# Patient Record
Sex: Male | Born: 1980 | Hispanic: No | Marital: Single | Smoking: Never smoker
Health system: Southern US, Community
[De-identification: ages and names within clinical notes are randomized; demographics above are authoritative.]

## PROBLEM LIST (undated history)

## (undated) DIAGNOSIS — J45909 Unspecified asthma, uncomplicated: Secondary | ICD-10-CM

## (undated) DIAGNOSIS — T7840XA Allergy, unspecified, initial encounter: Secondary | ICD-10-CM

## (undated) HISTORY — DX: Allergy, unspecified, initial encounter: T78.40XA

## (undated) HISTORY — DX: Unspecified asthma, uncomplicated: J45.909

---

## 2009-10-03 ENCOUNTER — Encounter: Admission: RE | Admit: 2009-10-03 | Discharge: 2009-10-03 | Payer: Self-pay | Admitting: Infectious Diseases

## 2012-03-10 ENCOUNTER — Ambulatory Visit (INDEPENDENT_AMBULATORY_CARE_PROVIDER_SITE_OTHER): Payer: BC Managed Care – PPO | Admitting: Internal Medicine

## 2012-03-10 VITALS — BP 111/77 | HR 70 | Temp 98.0°F | Resp 18 | Ht 64.5 in | Wt 154.2 lb

## 2012-03-10 DIAGNOSIS — J45901 Unspecified asthma with (acute) exacerbation: Secondary | ICD-10-CM | POA: Insufficient documentation

## 2012-03-10 DIAGNOSIS — J301 Allergic rhinitis due to pollen: Secondary | ICD-10-CM | POA: Insufficient documentation

## 2012-03-10 MED ORDER — BECLOMETHASONE DIPROPIONATE 80 MCG/ACT IN AERS: 1.0000 | INHALATION_SPRAY | RESPIRATORY_TRACT | Status: DC | PRN

## 2012-03-10 MED ORDER — FLUTICASONE PROPIONATE 50 MCG/ACT NA SUSP: 2.0000 | Freq: Every day | NASAL | Status: DC

## 2012-03-10 MED ORDER — PREDNISONE 20 MG PO TABS: ORAL_TABLET | ORAL | Status: DC

## 2012-03-10 MED ORDER — ALBUTEROL SULFATE HFA 108 (90 BASE) MCG/ACT IN AERS: 2.0000 | INHALATION_SPRAY | Freq: Four times a day (QID) | RESPIRATORY_TRACT | Status: DC | PRN

## 2012-03-10 NOTE — Patient Instructions (Signed)
Use both Qvar and Flonase until the end of June

## 2012-03-10 NOTE — Progress Notes (Signed)
  Subjective:    Patient ID: Todd Castro, male    DOB: 03/20/1981, 31 y.o.   MRN: [id number]  HPI36 hours ago he began wheezing and this is not controlled by his use of albuterol There is no fever, URI, pharyngitis He has a long history of asthma which is usually only active in the spring and occasionally in the fall He currently has runny nose with sneezing but no eye irritation which started one to 2 weeks ago His never been hospitalized for his asthma but has needed steroids in the past    Review of SystemsDenies headaches, vision changes, palpitations or chest pain, rashes     Objective:   Physical ExamVital signs reveal mild overweight Eyes are clear with pale conjunctiva Tympanic membranes clear Nares pale and boggy with increased clear rhinorrhea Oropharynx is clear No anterior cervical nodes Lungs exhibit wheezes bilaterally on inspiration and expiration There is no increased work of breathing or tachypnea The heart is regular without murmurs rubs or gallops        Assessment & Plan:  Problem #1 asthma exacerbation Problem #2 uncontrolled allergic rhinitis not responding to over-the-counter antihistamines  Eight-day course of prednisone starting at 80 mg Add Flonase to over-the-counter antihistamines  Add Qvar 80 one puff twice a day Continue albuterol Use this therapy for 3 months Followup in 48-72 hours if not controlled

## 2012-10-21 ENCOUNTER — Emergency Department (HOSPITAL_COMMUNITY)
Admission: EM | Admit: 2012-10-21 | Discharge: 2012-10-21 | Disposition: A | Discharge status: Home / Self Care | Payer: Self-pay | Attending: Emergency Medicine | Admitting: Emergency Medicine

## 2012-10-21 ENCOUNTER — Encounter (HOSPITAL_COMMUNITY): Payer: Self-pay | Admitting: Physical Medicine and Rehabilitation

## 2012-10-21 DIAGNOSIS — R109 Unspecified abdominal pain: Secondary | ICD-10-CM | POA: Insufficient documentation

## 2012-10-21 DIAGNOSIS — J45909 Unspecified asthma, uncomplicated: Secondary | ICD-10-CM | POA: Insufficient documentation

## 2012-10-21 DIAGNOSIS — Z79899 Other long term (current) drug therapy: Secondary | ICD-10-CM | POA: Insufficient documentation

## 2012-10-21 DIAGNOSIS — Z87442 Personal history of urinary calculi: Secondary | ICD-10-CM | POA: Insufficient documentation

## 2012-10-21 DIAGNOSIS — R319 Hematuria, unspecified: Secondary | ICD-10-CM | POA: Insufficient documentation

## 2012-10-21 LAB — CBC WITH DIFFERENTIAL/PLATELET
Basophils Absolute: 0 10*3/uL (ref 0.0–0.1)
Eosinophils Absolute: 0.2 10*3/uL (ref 0.0–0.7)
Eosinophils Relative: 3 % (ref 0–5)
MCH: 30 pg (ref 26.0–34.0)
MCHC: 35 g/dL (ref 30.0–36.0)
MCV: 85.9 fL (ref 78.0–100.0)
Monocytes Absolute: 0.8 10*3/uL (ref 0.1–1.0)
Neutrophils Relative %: 52 % (ref 43–77)
RBC: 5.66 MIL/uL (ref 4.22–5.81)
RDW: 12 % (ref 11.5–15.5)
WBC: 7.6 10*3/uL (ref 4.0–10.5)

## 2012-10-21 LAB — COMPREHENSIVE METABOLIC PANEL
ALT: 184 U/L — ABNORMAL HIGH (ref 0–53)
Albumin: 4.4 g/dL (ref 3.5–5.2)
Alkaline Phosphatase: 53 U/L (ref 39–117)
CO2: 32 mEq/L (ref 19–32)
Calcium: 9.8 mg/dL (ref 8.4–10.5)
Creatinine, Ser: 0.95 mg/dL (ref 0.50–1.35)
GFR calc Af Amer: >90 mL/min (ref >90)
Glucose, Bld: 136 mg/dL — ABNORMAL HIGH (ref 70–99)
Potassium: 4.1 mEq/L (ref 3.5–5.1)
Sodium: 142 mEq/L (ref 135–145)
Total Protein: 8.2 g/dL (ref 6.0–8.3)

## 2012-10-21 LAB — URINALYSIS, ROUTINE W REFLEX MICROSCOPIC
Bilirubin Urine: NEGATIVE
Glucose, UA: NEGATIVE mg/dL
Nitrite: NEGATIVE
Protein, ur: NEGATIVE mg/dL
Specific Gravity, Urine: 1.019 (ref 1.005–1.030)
Urobilinogen, UA: 0.2 mg/dL (ref 0.0–1.0)

## 2012-10-21 LAB — URINE MICROSCOPIC-ADD ON

## 2012-10-21 NOTE — ED Provider Notes (Signed)
History     CSN: [id number]  Arrival date & time 10/21/12  1427   None     Chief Complaint  Patient presents with  . Flank Pain    (Consider location/radiation/quality/duration/timing/severity/associated sxs/prior treatment) HPI R side flank pain stared suddenly about two hours ago.  Pain described as severe, 10/10 in intensity at maximum. Currently the pain is gone. The location of the patient's problem is R flank.  Onset was two hours ago with intermittent course since that time.   Modifying factors:  none.  Associated symptoms: emesis. No dysuria or hematuria. No back pain. No fever.   No past medical history on file.  No past surgical history on file.  History reviewed. No pertinent family history.  History  Substance Use Topics  . Smoking status: Never Smoker   . Smokeless tobacco: Never Used  . Alcohol Use: No      Review of Systems Negative for respiratory distress, cough. Negative for constipation, diarrhea.  All other systems reviewed and negative unless noted in HPI.    Allergies  Review of patient's allergies indicates no known allergies.  Home Medications   Current Outpatient Rx  Name  Route  Sig  Dispense  Refill  . ALBUTEROL SULFATE HFA 108 (90 BASE) MCG/ACT IN AERS   Inhalation   Inhale 2 puffs into the lungs 3 (three) times daily.         . ALBUTEROL SULFATE HFA 108 (90 BASE) MCG/ACT IN AERS   Inhalation   Inhale 2 puffs into the lungs every 6 (six) hours as needed for wheezing.   1 Inhaler   5   . BECLOMETHASONE DIPROPIONATE 80 MCG/ACT IN AERS   Inhalation   Inhale 1 puff into the lungs as needed.   1 Inhaler   12   . FLUTICASONE PROPIONATE 50 MCG/ACT NA SUSP   Nasal   Place 2 sprays into the nose daily.   16 g   6   . PREDNISONE 20 MG PO TABS      4/4/3/3/2/2/1/1 Single daily dose for 8 days   20 tablet   0     BP 154/124  Pulse 97  Temp 98.6 F (37 C) (Oral)  Resp 18  SpO2 98%  Physical Exam Nursing note and  vitals reviewed.  Constitutional: Pt is alert and appears stated age. Oropharynx: Airway open without erythema or exudate. Respiratory: No respiratory distress. Equal breathing bilaterally. CV: Extremities warm and well perfused. Neuro: No motor nor sensory deficit. Head: Normocephalic and atraumatic. Eyes: No conjunctivitis, no scleral icterus. Neck: Supple, no mass. Chest: Non-tender. Abdomen: Soft, non-tender. Negative murphy's sign.  MSK: Extremities are atraumatic without deformity. Skin: No rash, no wounds.  ED Course  Procedures (including critical care time)  Labs Reviewed  COMPREHENSIVE METABOLIC PANEL - Abnormal; Notable for the following:    Glucose, Bld 136 (*)     AST 88 (*)     ALT 184 (*)     All other components within normal limits  URINALYSIS, ROUTINE W REFLEX MICROSCOPIC - Abnormal; Notable for the following:    APPearance HAZY (*)     Hgb urine dipstick LARGE (*)     All other components within normal limits  CBC WITH DIFFERENTIAL  LIPASE, BLOOD  URINE MICROSCOPIC-ADD ON   No results found.   1. Flank pain   2. Hematuria       MDM  31 y.o. male here with R flank pain.  Pertinent past problems include  asthma. Pain colicky in nature. Severe in triage. No pain now. History is c/w passed kidney stone. Will continue to monitor for recurrence of pain and await labs.   Medications/interventions:  None. Pt without pain at time of my evaluation. .  Data reviewed: Record review: n/a. EKG ordered and interpreted by me: not indicated.  Lab tests ordered and reviewed by me: CBC unremarkable. Lipase low. CMP with elevated AST/ALT. Pt without RUQ tenderness to success gall bladder etiology. UA with large amount of blood.    Course of care: On re-eval pt still without pain. Resting comfortably. Discussed likely passed kidney stone and recommended f/u with urology and establishing PCP. Will give urinary strainer. Counseling provided regarding diagnosis,  treatment plan, follow up recommendations, and return precautions. Questions answered.  Medical Decision Making discussed with ED attending Doug Sou, MD          Charm Barges, MD 10/21/12 [phone]

## 2012-10-21 NOTE — ED Notes (Signed)
Pt reports about 2 hours ago he started having right sided flank pain that increasingly became worse. Pt reports he was in excruciating pain rating it at a 10/10. Pt now reports pain is gone and he feels much better. Pt denies burning sensation while urinating and urinary frequency.

## 2012-10-21 NOTE — ED Notes (Signed)
Pt refused IV. Pt reports he wants the lab results to come back before he gets an IV. Pt was told that it will delay care later on if he needs IV medication. Pt acknowledged understanding and still refused IV.

## 2012-10-21 NOTE — ED Notes (Signed)
Pt given urine cup and asked for a urine sample, pt reports he is unable to urinate at this time.

## 2012-10-21 NOTE — ED Notes (Signed)
Pt presents to department for evaluation of R sided flank pain. Sudden onset today. Denies urinary symptoms. 10/10 pain at the time. Pt is alert and oriented x4. Moaning and unable to sit still in triage.

## 2012-10-21 NOTE — ED Provider Notes (Signed)
Complained of right flank pain onset 2 hours prior to coming here resolved spontaneously without treatment vomited one time. Symptoms lasted 2 hours patient now asymptomatic pain is severe. Last bowel movement yesterday, normal no fever exam alert nontoxic lungs clear auscultation heart regular rhythm abdomen soft nontender. No flank tenderness genitalia normal male   Doug Sou, MD 10/21/12 1520

## 2012-10-22 NOTE — ED Provider Notes (Signed)
I have personally seen and examined the patient.  I have discussed the plan of care with the resident.  I have reviewed the documentation on PMH/FH/Soc. History.  I have reviewed the documentation of the resident and agree.  Doug Sou, MD 10/22/12 [phone]

## 2013-04-06 ENCOUNTER — Encounter: Payer: Self-pay | Admitting: Family Medicine

## 2013-04-06 ENCOUNTER — Ambulatory Visit: Payer: Self-pay | Admitting: Physician Assistant

## 2013-04-06 ENCOUNTER — Ambulatory Visit: Payer: Self-pay

## 2013-04-06 VITALS — BP 112/84 | HR 70 | Temp 98.8°F | Resp 16 | Ht 64.5 in | Wt 165.4 lb

## 2013-04-06 DIAGNOSIS — Z0000 Encounter for general adult medical examination without abnormal findings: Secondary | ICD-10-CM

## 2013-04-06 DIAGNOSIS — Z111 Encounter for screening for respiratory tuberculosis: Secondary | ICD-10-CM

## 2013-04-06 DIAGNOSIS — J45909 Unspecified asthma, uncomplicated: Secondary | ICD-10-CM

## 2013-04-06 MED ORDER — PREDNISONE 20 MG PO TABS: ORAL_TABLET | ORAL | Status: DC

## 2013-04-06 MED ORDER — FLUTICASONE PROPIONATE 50 MCG/ACT NA SUSP: 2.0000 | Freq: Every day | NASAL | Status: DC

## 2013-04-06 MED ORDER — ALBUTEROL SULFATE HFA 108 (90 BASE) MCG/ACT IN AERS: 2.0000 | INHALATION_SPRAY | Freq: Four times a day (QID) | RESPIRATORY_TRACT | Status: DC | PRN

## 2013-04-06 NOTE — Progress Notes (Signed)
Patient ID: Todd Castro MRN: [id number], DOB: October 02, 1981 32 y.o. Date of Encounter: 04/06/2013, 5:25 PM  Primary Physician: Default, Provider, MD  Chief Complaint: Physical (CPE)  HPI: 32 y.o. 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 Doing well. Originally from [place], moved here 12 years ago. 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 At this time he was found to have a negative 1 view CXR. Asymptomatic. 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 Program is 3 semesters.   He also requests a refill of his albuterol and Flonase. Notes this time of year his asthma seems to flare up on him. This time of year he notes that he uses his albuterol inhaler 2-3 times per day. At baseline he does not have to use his albuterol inhaler at all. Symptoms are worse at night. Some wheezing and shortness of breath. Some associated sneezing during the day. Afebrile.   Review of Systems: Consitutional: No fever, chills, fatigue, night sweats, lymphadenopathy, or weight changes. Eyes: No visual changes, eye redness, or discharge. ENT/Mouth: Ears: No otalgia, tinnitus, hearing loss, discharge. Nose: Positive for sneezing. No congestion, rhinorrhea, sinus pain, or epistaxis. Throat: No sore throat, post nasal drip, or teeth pain. Cardiovascular: No CP, palpitations, diaphoresis, DOE, edema, orthopnea, PND. Respiratory: Positive for SOB and wheezing. No cough, dyspnea, or hemoptysis. Gastrointestinal: No anorexia, dysphagia, reflux, pain, nausea, vomiting, hematemesis, diarrhea, constipation, BRBPR, or melena. Genitourinary: No dysuria, frequency, urgency, hematuria, incontinence, nocturia, decreased urinary stream, discharge, impotence, or testicular pain/masses. Musculoskeletal: No decreased ROM, myalgias, stiffness, joint swelling, or weakness. Skin: No rash, erythema, lesion  changes, pain, warmth, jaundice, or pruritis. Neurological: No headache, dizziness, syncope, seizures, tremors, memory loss, coordination problems, or paresthesias. Psychological: No anxiety, depression, hallucinations, SI/HI. Endocrine: No fatigue, polydipsia, polyphagia, polyuria, or known diabetes.   Past Medical History  Diagnosis Date  . Allergy   . Asthma      No past surgical history on file.  Home Meds:  Prior to Admission medications   Medication Sig Start Date End Date Taking? Authorizing Provider  albuterol (PROVENTIL HFA;VENTOLIN HFA) 108 (90 BASE) MCG/ACT inhaler Inhale 2 puffs into the lungs every 6 (six) hours as needed. For wheezing   Yes Historical Provider, MD  Cetirizine HCl (ZYRTEC PO) Take 1 tablet by mouth daily.   Yes Historical Provider, MD  fluticasone (FLONASE) 50 MCG/ACT nasal spray Place 2 sprays into the nose daily.   Yes Historical Provider, MD  loratadine (CLARITIN) 10 MG tablet Take 10 mg by mouth daily.    Historical Provider, MD    Allergies:  Allergies  Allergen Reactions  . Ppd (Tuberculin Purified Protein Derivative) Itching and Swelling    History   Social History  . Marital Status: Single    Spouse Name: N/A    Number of Children: N/A  . Years of Education: N/A   Occupational History  . Not on file.   Social History Main Topics  . Smoking status: Never Smoker   . Smokeless tobacco: Never Used  . Alcohol Use: No  . Drug Use: No  . Sexually Active: Yes   Other Topics Concern  . Not on file   Social History Narrative  . No narrative on file    No family history on file.  Physical Exam: Blood pressure 112/84, pulse 70, temperature 98.8 F (37.1 C),  temperature source Oral, resp. rate 16, height 5' 4.5" (1.638 m), weight 165 lb 6.4 oz (75.025 kg), SpO2 97.00%.  General: Well developed, well nourished, in no acute distress. HEENT: Normocephalic, atraumatic. Conjunctiva pink, sclera non-icteric. Pupils 2 mm constricting to 1  mm, round, regular, and equally reactive to light and accomodation. EOMI. Internal auditory canal clear. TMs with good cone of light and without pathology. Nasal mucosa pink. Nares are without discharge. No sinus tenderness. Oral mucosa pink. Dentition normal. Pharynx without exudate.   Neck: Supple. Trachea midline. No thyromegaly. Full ROM. No lymphadenopathy. Lungs: Clear to auscultation bilaterally without wheezes, rales, or rhonchi. Breathing is of normal effort and unlabored. Cardiovascular: RRR with S1 S2. No murmurs, rubs, or gallops appreciated. Distal pulses 2+ symmetrically. No carotid or abdominal bruits. Abdomen: Soft, non-tender, non-distended with normoactive bowel sounds. No hepatosplenomegaly or masses. No rebound/guarding. No CVA tenderness. Without hernias.   Genitourinary: Uncircumcised male. No penile lesions. Testes descended bilaterally, and smooth without tenderness or masses.  Musculoskeletal: Full range of motion and 5/5 strength throughout. Without swelling, atrophy, tenderness, crepitus, or warmth. Extremities without clubbing, cyanosis, or edema. Calves supple. Skin: Warm and moist without erythema, ecchymosis, wounds, or rash. Neuro: A+Ox3. CN II-XII grossly intact. Moves all extremities spontaneously. Full sensation throughout. Normal gait. DTR 2+ throughout upper and lower extremities. Finger to nose intact. Psych:  Responds to questions appropriately with a normal affect.   Studies:  CXR: [facility] reading (PRIMARY) by  Dr. Merla Riches. Negative  Assessment/Plan:  32 y.o. 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 Had negative CXR in 2012. Performed 1 view CXR today  to satisfy requirement of school paperwork while keeping patient's finances in mind. -He will proceed to the [facility] for his second varicella vaccine. -Form completed on a provisional status pending the above vaccine -See eye MD -Anticipatory guidance   Signed, Eula Listen, PA-C 04/06/2013 5:25 PM

## 2014-04-07 ENCOUNTER — Ambulatory Visit: Payer: Self-pay | Admitting: Emergency Medicine

## 2014-04-07 VITALS — BP 102/70 | HR 77 | Temp 98.1°F | Resp 18 | Ht 61.5 in | Wt 164.0 lb

## 2014-04-07 DIAGNOSIS — J45909 Unspecified asthma, uncomplicated: Secondary | ICD-10-CM

## 2014-04-07 DIAGNOSIS — J45901 Unspecified asthma with (acute) exacerbation: Secondary | ICD-10-CM

## 2014-04-07 DIAGNOSIS — J309 Allergic rhinitis, unspecified: Secondary | ICD-10-CM

## 2014-04-07 MED ORDER — MOMETASONE FURO-FORMOTEROL FUM 100-5 MCG/ACT IN AERO: 2.0000 | INHALATION_SPRAY | Freq: Two times a day (BID) | RESPIRATORY_TRACT | Status: DC

## 2014-04-07 MED ORDER — MOMETASONE FURO-FORMOTEROL FUM 100-5 MCG/ACT IN AERO: 2.0000 | INHALATION_SPRAY | Freq: Two times a day (BID) | RESPIRATORY_TRACT | Status: AC

## 2014-04-07 MED ORDER — FLUTICASONE PROPIONATE 50 MCG/ACT NA SUSP: 2.0000 | Freq: Every day | NASAL | Status: AC

## 2014-04-07 MED ORDER — FLUTICASONE PROPIONATE 50 MCG/ACT NA SUSP: 2.0000 | Freq: Every day | NASAL | Status: DC

## 2014-04-07 MED ORDER — ALBUTEROL SULFATE HFA 108 (90 BASE) MCG/ACT IN AERS: 2.0000 | INHALATION_SPRAY | RESPIRATORY_TRACT | Status: AC | PRN

## 2014-04-07 MED ORDER — ALBUTEROL SULFATE HFA 108 (90 BASE) MCG/ACT IN AERS: 2.0000 | INHALATION_SPRAY | RESPIRATORY_TRACT | Status: DC | PRN

## 2014-04-07 NOTE — Progress Notes (Signed)
[facility] [address], [place] [address] [phone]  Date:  04/07/2014   Name:  Todd SackLai Thanh Catalina   DOB:  May 19, 1981   MRN:  [id number]  PCP:  Default, Provider, MD    Chief Complaint: Allergies, Asthma and Shortness of Breath   History of Present Illness:  Todd SackLai Thanh Dawood is a 33 y.o. very pleasant male patient who presents with the following:  History of seasonal allergic rhinitis and asthma.  Using MDI 3-4 times daily.  Requesting refill and prednisone.  No fever or chills.  No cough, wheezing or shortness of breath. No improvement with over the counter medications or other home remedies. Denies other complaint or health concern today.   Patient Active Problem List   Diagnosis Date Noted  . Asthma exacerbation 03/10/2012  . Allergic rhinitis due to pollen 03/10/2012    Past Medical History  Diagnosis Date  . Allergy   . Asthma     History reviewed. No pertinent past surgical history.  History  Substance Use Topics  . Smoking status: Never Smoker   . Smokeless tobacco: Never Used  . Alcohol Use: No    History reviewed. No pertinent family history.  Allergies  Allergen Reactions  . 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  Current Outpatient Prescriptions on File Prior to Visit  Medication Sig Dispense Refill  . albuterol (PROVENTIL HFA;VENTOLIN HFA) 108 (90 BASE) MCG/ACT inhaler Inhale 2 puffs into the lungs every 6 (six) hours as needed for wheezing.  1 Inhaler  2  . Cetirizine HCl (ZYRTEC PO) Take 1 tablet by mouth daily.      . fluticasone (FLONASE) 50 MCG/ACT nasal spray Place 2 sprays into the nose daily.  16 g  6  . loratadine (CLARITIN) 10 MG tablet Take 10 mg by mouth daily.      . predniSONE (DELTASONE) 20 MG tablet 3 PO FOR 2 DAYS, 2 PO FOR 2 DAYS, 1 PO FOR 2 DAYS  12 tablet  0   No current facility-administered medications on file prior to visit.     Review of Systems:  As per HPI, otherwise negative.    Physical Examination: Filed Vitals:   04/07/14 2028  BP: 102/70  Pulse: 77  Temp: 98.1 F (36.7 C)  Resp: 18   Filed Vitals:   04/07/14 2028  Height: 5' 1.5" (1.562 m)  Weight: 164 lb (74.39 kg)   Body mass index is 30.49 kg/(m^2). Ideal Body Weight: Weight in (lb) to have BMI = 25: 134.2  GEN: WDWN, NAD, Non-toxic, A & O x 3 HEENT: Atraumatic, Normocephalic. Neck supple. No masses, No LAD. Ears and Nose: No external deformity. CV: RRR, No M/G/R. No JVD. No thrill. No extra heart sounds. PULM: CTA B, no wheezes, crackles, rhonchi. No retractions. No resp. distress. No accessory muscle use. ABD: S, NT, ND, +BS. No rebound. No HSM. EXTR: No c/c/e NEURO Normal gait.  PSYCH: Normally interactive. Conversant. Not depressed or anxious appearing.  Calm demeanor.    Assessment and Plan: Seasonal allergic rhinitis Asthma Albuterol mdi dulera   Signed,  Phillips OdorJeffery Valari Taylor, MD

## 2014-04-07 NOTE — Addendum Note (Signed)
Addended by: Vira AgarFARRINGTON, Ryman Rathgeber L on: 04/07/2014 08:52 PM   Modules accepted: Orders, Medications

## 2014-04-07 NOTE — Patient Instructions (Signed)
Todd Castro, Ng??i L?n (Asthma, Adult) Todd Castro l tnh tr?ng ?nh h??ng t?i ph?i. N c ??c ?i?m l s?ng v h?p ???ng th? c?ng nh? t?ng ti?t d?ch nh?y. H?p l do s?ng v co th?t c? bn trong ???ng th?. Khi ?i?u ny x?y ra, th? c th? kh kh?n v b?n c th? c ho, th? kh kh v kh th?. Hi?u bi?t nhi?u v? Todd Castro c th? gip b?n x? tr n t?t h?n. Khng th? ch?a kh?i Todd Castro, nh?ng thu?c v thay ??i cch s?ng c th? gip ki?m sot n. Todd Castro c th? l v?n ?? nh? ??i v?i m?t s? ng??i nh?ng n?u Todd Castro khng ???c ki?m sot , n c th? d?n t?i c?n Todd ?e d?a ??n tnh m?ng. Todd Castro c th? thay ??i theo th?i gian. ?i?u quan tr?ng l c?n ph?i h?p v?i chuyn gia ch?m [place] s?c kh?e c?a b?n ?? x? tr cc tri?u ch?ng Todd Castro.  NGUYN NHN Khng r nguyn nhn chnh xc gy Todd Castro. Todd Castro ???c cho l do di truy?n (di truy?n) v ti?p xc v?i mi tr??ng. S?ng v t?y ?? (vim) ???ng th? x?y ra trong Todd Castro. ?i?u ny c th? b?t ??u b?i d? ?ng, nhi?m trng ph?i do vi rt, ho?c cc ch?t kch thch trong khng kh. Ph?n ?ng d? ?ng c th? khi?n b?n th? kh kh ngay l?p t?c ho?c vi gi? sau khi ti?p xc. Tc nhn lm kh?i pht Todd Castro khc nhau ??i v?i t?ng ng??i. ?i?u quan tr?ng l ph?i ch  v bi?t nguyn nhn lm kh?i pht Todd Castro. Tc nhn ph? bi?n gy ra c?n Todd bao g?m:  B?i b?n t? da, tc ??ng v?t ho?c lng ??ng v?t.  M?t b?i c trong b?i trong nh.  Gin.  Ph?n hoa t? cy ho?c c?.  N?m m?c.  Thu?c l ho?c khi thu?c l. Khng th? cho php ht thu?c trong nh c ng??i b? Todd Castro. Ng??i b? Todd Castro khng nn ht thu?c v khng nn ? g?n ng??i ht thu?c.  Cc ch?t  nhi?m khng kh nh? b?i, ch?t t?y r?a gia d?ng, ch?t x?t tc, ch?t phun x?t, h?i s?n, ha ch?t m?nh ho?c mi m?nh.  Khng kh l?nh ho?c thay ??i th?i ti?t. Khng kh l?nh c th? gy vim. Gi lm t?ng n?m m?c, ph?n hoa trong khng kh. Khng c m?t kh h?u t?t nh?t cho ng??i b? Todd Castro.  Nh?ng c?m xc m?nh nh? khc  ho?c c??i nhi?u.  C?ng th?ng.  M?t s? lo?i thu?c nh?t ??nh nh? aspirin ho?c thu?c ch?n beta.  [employer] trong cc lo?i th?c ph?m v ?? u?ng nh? tri cy kh v r??u vang.  Cc b?nh nhi?m trng ho?c tnh tr?ng vim nhi?m nh? cm, c?m l?nh ho?c vim nim m?c m?i (vim m?i).  B?nh tro ng??c d? dy th?c qu?n (GERD). GERD l tnh tr?ng d?ch axit trong d? dy tro ng??c ln c? h?ng (th?c qu?n).  T?p luy?n ho?c ho?t ??ng n?ng. Thu?c tr??c khi t?p th? d?c thch h?p cho php h?u h?t m?i ng??i tham gia cc mn th? thao. TRI?U CH?NG  C?m th?y kh th?.  T?c ho?c ?au ng?c.  Kh ng? do ho, th? kh kh ho?c c?m th?y kh th?.  Ti?ng hut so hay th? kh kh khi th? ra.  Ho ho?c th? kh kh tr?m tr?ng h?n khi b?n:  B? nhi?m vi rt (nh? c?m l?nh ho?c cm).  ?ang b?  d? ?ng.  Ti?p xc v?i m?t s? lo?i h?i ho?c ha ch?t nh?t ??nh.  T?p luy?n. D?u hi?u cho th?y Todd Castro c th? tr? nn t? h?n bao g?m:  D?u hi?u v tri?u ch?ng c?a Todd Castro th??ng xuyn v gy c?m gic kh ch?u h?n.  Kh th? t?ng ln. Hi?n t??ng ny c th? ???c ?o b?ng my ?o l?u l??ng ??nh, ?y l m?t thi?t b? ??n gi?n ???c s? d?ng ?? ki?m tra xem ph?i c?a b?n ?ang ho?t ??ng nh? th? no.  Nhu c?u s? d?ng ?ng ht gi?m kh th? nhanh v?i t?n s? th??ng xuyn h?n. CH?N ?ON Vi?c ch?n ?on Todd Castro ???c th?c hi?n b?ng cch xem xt b?nh s? c?a b?n, khm th?c th? v c th? t? cc xt nghi?m khc. Nghin c?u ch?c n?ng ph?i c th? gip ch?n ?on. ?I?U TR? Khng th? ch?a kh?i Todd Castro. Tuy nhin, ??i v?i ?a ph?n ng??i l?n, Todd Castro c th? ???c ki?m sot b?ng ?i?u tr?. Bn c?nh vi?c trnh cc tc nhn lm kh?i pht Todd Castro, th??ng c?n dng c? thu?c. C 2 lo?i thu?c ???c s? d?ng ?? ?i?u tr? Todd Castro: thu?c ki?m sot (gi?m vim v tri?u ch?ng) v thu?c lm gi?m ho?c thu?c gi?i c?u (gi?m tri?u ch?ng Todd Castro trong c?n Todd c?p tnh). B?n c th? c?n dng thu?c hng ngy ?? ki?m sot Todd Castro. Cc lo?i thu?c ki?m sot lu di hi?u qu? nh?t  v?i Todd Castro l corticosteroid d?ng ht (ng?n ch?n vim). Cc lo?i thu?c ki?m sot lu di khc bao g?m:  Nhm thu?c khng leukotriene (ch?n con ???ng vim).  Nhm thu?c ?c ch? beta 2 tc d?ng ko di (gin c? ? ???ng th? t nh?t 12 gi?) km theo m?t lo?i corticosteroid d?ng ht.  Cromolyn natri ho?c nedocromil (lm thay ??i kh? n?ng gi?i phng cc ha ch?t gy vim c?a m?t s? t? bo b? vim).  Thu?c ?i?u ha mi?n d?ch (lm thay ??i h? th?ng mi?n d?ch ?? ng?n ch?n cc tri?u ch?ng Todd Castro).  Theophylline (lm gin c? trong ???ng th?). B?n c?ng c th? c?n ph?i dng m?t lo?i thu?c ?c ch? beta2 tc d?ng ng?n ?? lm gi?m cc tri?u ch?ng Todd Castro trong c?n Todd c?p tnh. B?n nn hi?u ph?i lm g trong c?n Todd c?p tnh. Thu?c d?ng ht c hi?u qu? khi ???c s? d?ng ?ng cch. ??c h??ng d?n v? cch s? d?ng thu?c c?a b?n m?t cch chnh xc v ni chuy?n v?i chuyn gia ch?m [place] s?c kh?e n?u b?n c cu h?i. ??n g?p chuyn gia ch?m [place] s?c kh?e ?? khm l?i theo ??nh k? ?? ??m b?o b?nh Todd Castro c?a b?n ???c ki?m sot t?t. N?u Todd Castro khng ???c ki?m sot t?t, n?u b?n b? nh?p vi?n v Todd Castro, ho?c n?u c?n nhi?u lo?i thu?c ho?c corticosteroid d?ng ht li?u t? trung bnh ??n li?u cao ?? ki?m sot b?nh Todd Castro c?a b?n, hy yu c?u gi?i thi?u ??n m?t chuyn gia v? b?nh Todd Castro. H??NG D?N CH?M [place] T?I NH  S? d?ng thu?c theo ch? d?n c?a chuyn gia ch?m [place] s?c kh?e.  Ki?m sot mi tr??ng trong nh c?a b?n theo nh?ng cch sau ?? gip ng?n ch?n c?n Todd:  Thay b? l?c l s??i v ?i?u ha khng kh t nh?t m?t l?n m?i thng.  ??t b? l?c ho?c mi?ng v?i th?a trn cc l? thng h?i c?a l s??i v ?i?u ha khng kh.  H?n ch? s? d?ng l s??i v b?p c?i.  Khng ht thu?c l. Khng ? nh?ng n?i c ng??i khc ?ang ht thu?c.  Di?t v?t gy h?i (ch?ng h?n nh? gin v chu?t) v phn c?a chng.  N?u b?n nhn th?y n?m m?c trn cy, hy v?t n ?i.  Lau sn nh v ht b?i m?i tu?n. S? d?ng s?n ph?m lm v? sinh  khng mi. S? d?ng my ht b?i s?ch h?n c b? l?c HEPA, n?u c th?. N?u vi?c ht b?i ho?c v? sinh lm kh?i pht Todd Castro, hy c? g?ng tm ng??i no khc ?? lm cc vi?c v?t ny.  Sn nh nn lm b?ng g?, ? lt ho?c nh?a vinyl. Th?m c th? dnh lng v b?i.  S? d?ng g?i, ga tr?i gi??ng v ga tr?i n?m c l xo ch?ng d? ?ng.  Gi?t kh?n tr?i gi??ng v ch?n hng tu?n b?ng n??c nng v lm kh b?ng my s?y.  S? d?ng ch?n ???c lm b?ng s?i t?ng h?p ho?c bng d?t s?i ch?t.  Khng s? d?ng di?m x?p n?p b?n trn gi??ng.  V? sinh phng t?m v nh b?p b?ng thu?c t?y v s?n l?i b?ng s?n ch?ng n?m m?c.  R?a tay th??ng xuyn.  Ni chuy?n v?i chuyn gia ch?m [place] s?c kh?e c?a b?n v? m?t k? ho?ch hnh ??ng ?? x? tr c?n Todd. ?i?u ny bao g?m vi?c s? d?ng m?t my ?o l?u l??ng ??nh ?? ?o m?c ?? n?ng c?a c?n Todd v cc lo?i thu?c c th? gip ng?n ch?n c?n Todd. M?t k? ho?ch hnh ??ng c th? gip gi?m thi?u ho?c ng?n ch?n cc c?n Todd m khng c?n ph?i ?i khm.  Gi? bnh t?nh trong c?n Todd.  Lun c m?t k? ho?ch chu?n b? tham v?n v?i chuyn gia y t?. ?i?u ny bao g?m c? vi?c lin h? v?i chuyn gia ch?m [place] s?c kh?e c?a b?n v trong tr??ng h?p b? c?n Todd n?ng, hy g?i d?ch v? c?p t?i ??a ph??ng ([place] K?). HY ?I KHM N?U:  B?n th? kh kh, kh th? ho?c ho ngay c? khi ? dng thu?c ?? ng?n ch?n c?n Todd.  B?n c ?m ??c.  ?m c?a b?n thay ??i t? trong ho?c mu tr?ng sang mu vng, xanh, xm ho?c c mu.  B?n c b?t k? v?n ?? no lin quan ??n thu?c ?ang s? d?ng (ch?ng h?n nh? pht ban, ng?a, s?ng ho?c kh th?).  B?n s? d?ng thu?c lm gi?m Todd Castro nhi?u h?n 2-3 l?n m?i tu?n.  L?u l??ng ??nh c?a b?n v?n ? kho?ng 50-79% ch? s? c nhn t?t nh?t sau khi lm theo k? ho?ch hnh ??ng c?a b?n trong 1 gi?. HY NGAY L?P T?C ?I KHM N?U:  B?n kh th? ngay c? khi ngh?Marland Kitchen  B?n kh th? khi th?c hi?n ho?t ??ng th? ch?t r?t t.  B?n g?p kh kh?n khi ?n, u?ng ho?c ni chuy?n do cc tri?u ch?ng Todd Castro.  B?n  b? ?au ng?c ho?c c?m th?y tim ??p nhanh.  Mi ho?c mng tay c?a b?n c mu xanh nh?t.  B?n b? chong vng, chng m?t ho?c ng?t x?u.  B?n b? s?t ho?c c cc tri?u ch?ng ko di h?n 2-3 ngy.  B?n b? s?t v cc tri?u ch?ng ??t nhin x?u ?i.  B?n d??ng nh? tr? nn n?ng h?n v khng ?p ?ng v?i vi?c ?i?u tr? trong khi c c?n Todd.  L?u l??ng ??nh c?a b?n d??i 50% ch?  s? c nhn t?t nh?t. ??M B?O B?N:  Hi?u cc h??ng d?n ny.  S? theo di tnh tr?ng c?a mnh.  S? yu c?u tr? gip ngay l?p t?c n?u b?n c?m th?y khng ?? ho?c tnh tr?ng tr?m tr?ng h?n. Document Released: 12/03/2005 Document Revised: 08/05/2013 [facility] Patient Information 2014 [place] PointExitCare, MarylandLL[place].

## 2014-04-08 ENCOUNTER — Ambulatory Visit: Payer: No Typology Code available for payment source

## 2014-04-10 IMAGING — CR DG CHEST 1V
1 series · 1 of 1 positions shown · non-contrast
Comparison: None.

CLINICAL DATA: Positive TB test

CHEST - 1 VIEW

[PA]
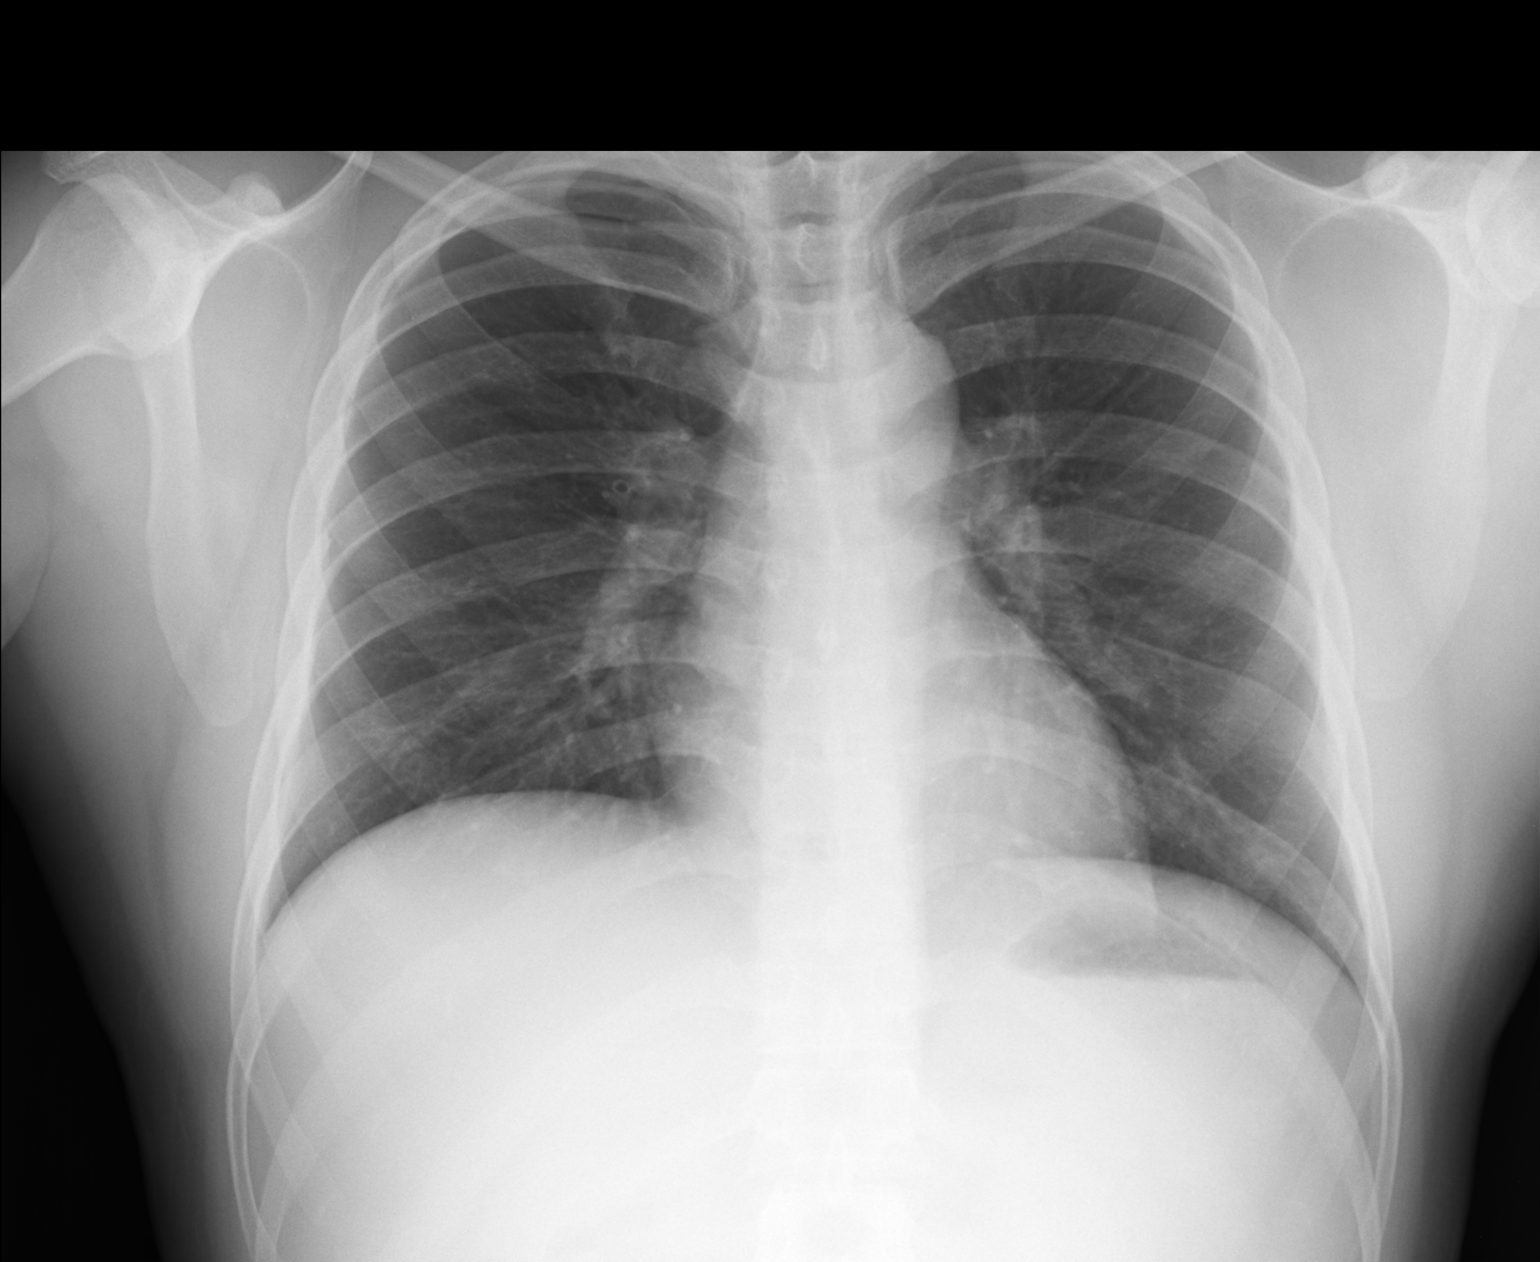

[1 of 1 positions shown; findings below may reference images not displayed]

FINDINGS: The heart and pulmonary vascularity are within normal
limits.  The lungs are clear bilaterally.  No acute bony
abnormality is seen.
IMPRESSION: No acute abnormality noted.

## 2014-08-23 ENCOUNTER — Ambulatory Visit (INDEPENDENT_AMBULATORY_CARE_PROVIDER_SITE_OTHER): Payer: No Typology Code available for payment source | Admitting: Family Medicine

## 2014-08-23 VITALS — BP 104/68 | HR 79 | Temp 98.1°F | Resp 16 | Ht 64.5 in | Wt 163.2 lb

## 2014-08-23 DIAGNOSIS — J208 Acute bronchitis due to other specified organisms: Secondary | ICD-10-CM

## 2014-08-23 DIAGNOSIS — J209 Acute bronchitis, unspecified: Secondary | ICD-10-CM

## 2014-08-23 MED ORDER — AZITHROMYCIN 250 MG PO TABS: ORAL_TABLET | ORAL | Status: AC

## 2014-08-23 MED ORDER — BENZONATATE 100 MG PO CAPS: 100.0000 mg | ORAL_CAPSULE | Freq: Three times a day (TID) | ORAL | Status: AC | PRN

## 2014-08-23 NOTE — Progress Notes (Signed)
[facility] [address] [place] [address] [phone]  Date:  08/23/2014   Name:  Todd Castro   DOB:  [birth year]   MRN:  [id number]  PCP:  Default, Provider, MD    Chief Complaint: Cough and Fever   History of Present Illness:  Chistopher Castro is a 33 y.o. very pleasant male patient who presents with the following:  He is here today with illness.  He has noted a productive cough- yellow colored sputum- for about one week.  Also he had noted a fever- subjective only.  However this does seem to be getting better.   He did have a ST, some earache but these sx are now better.  He also had nasal congestion At this time he just has the cough and chest congestion, wheezing.  He has tried OTC medicaitons as needed.    He does have a history of asthma.  However his inhaler is not helping with his sx as much as it usually does currently. He is otherwise healthy  No GI symptoms.  Patient Active Problem List   Diagnosis Date Noted  . Asthma exacerbation 03/10/2012  . Allergic rhinitis due to pollen 03/10/2012    Past Medical History  Diagnosis Date  . Allergy   . Asthma     History reviewed. No pertinent past surgical history.  History  Substance Use Topics  . Smoking status: Never Smoker   . Smokeless tobacco: Never Used  . Alcohol Use: No    No family history on file.  Allergies  Allergen Reactions  . 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  Current Outpatient Prescriptions on File Prior to Visit  Medication Sig Dispense Refill  . albuterol (PROVENTIL HFA;VENTOLIN HFA) 108 (90 BASE) MCG/ACT inhaler Inhale 2 puffs into the lungs every 4 (four) hours as needed for wheezing.  1 Inhaler  12  . loratadine (CLARITIN) 10 MG tablet Take 10 mg by mouth daily.      . mometasone-formoterol (DULERA) 100-5 MCG/ACT AERO Inhale 2 puffs into the lungs 2 (two) times daily.  1 Inhaler   12  . Cetirizine HCl (ZYRTEC PO) Take 1 tablet by mouth daily.      . fluticasone (FLONASE) 50 MCG/ACT nasal spray Place 2 sprays into both nostrils daily.  16 g  12   No current facility-administered medications on file prior to visit.    Review of Systems:  As per HPI- otherwise negative.   Physical Examination: Filed Vitals:   08/23/14 1248  BP: 104/68  Pulse: 79  Temp: 98.1 F (36.7 C)  Resp: 16   Filed Vitals:   08/23/14 1248  Height: 5' 4.5" (1.638 m)  Weight: 163 lb 3.2 oz (74.027 kg)   Body mass index is 27.59 kg/(m^2). Ideal Body Weight: Weight in (lb) to have BMI = 25: 147.6  GEN: WDWN, NAD, Non-toxic, A & O x 3, looks well HEENT: Atraumatic, Normocephalic. Neck supple. No masses, No LAD.  Bilateral TM wnl, oropharynx normal.  PEERL,EOMI.   Ears and Nose: No external deformity. CV: RRR, No M/G/R. No JVD. No thrill. No extra heart sounds. PULM: CTA B, no wheezes.. No retractions. No resp. distress. No accessory muscle use.  Diffuse rhonchi in both lungs EXTR: No c/c/e NEURO Normal gait.  PSYCH: Normally interactive. Conversant. Not depressed or anxious appearing.  Calm demeanor.    Assessment and Plan: Acute bronchitis  due to other specified organisms - Plan: azithromycin (ZITHROMAX) 250 MG tablet, benzonatate (TESSALON) 100 MG capsule  Treat for bronchitis in a pt with asthma and productive cough, rhonchi in lungs. Azithromycin and tessalon as needed.  Follow-up if not better soon- Sooner if worse.     Signed Abbe Amsterdam, MD

## 2014-08-23 NOTE — Patient Instructions (Signed)
Use the azithromycin antibiotic as directed, and the tessalon perles as needed for cough.  Let me know if you are not better soon!

## 2017-01-05 ENCOUNTER — Telehealth: Payer: Self-pay

## 2017-01-05 NOTE — Telephone Encounter (Signed)
Patient needs a copy of his chest x-ray report, he stated he does not need the actual x-ray just the report, he would like to pick them up as soon as possible. Thank you
